# Patient Record
Sex: Female | Born: 1955 | Hispanic: Yes | Marital: Married | Smoking: Never smoker
Health system: Southern US, Community
[De-identification: ages and names within clinical notes are randomized; demographics above are authoritative.]

## PROBLEM LIST (undated history)

## (undated) DIAGNOSIS — Z9889 Other specified postprocedural states: Secondary | ICD-10-CM

## (undated) DIAGNOSIS — E119 Type 2 diabetes mellitus without complications: Secondary | ICD-10-CM

## (undated) DIAGNOSIS — G5603 Carpal tunnel syndrome, bilateral upper limbs: Secondary | ICD-10-CM

## (undated) DIAGNOSIS — R112 Nausea with vomiting, unspecified: Secondary | ICD-10-CM

## (undated) DIAGNOSIS — I10 Essential (primary) hypertension: Secondary | ICD-10-CM

## (undated) DIAGNOSIS — T4145XA Adverse effect of unspecified anesthetic, initial encounter: Secondary | ICD-10-CM

## (undated) DIAGNOSIS — K219 Gastro-esophageal reflux disease without esophagitis: Secondary | ICD-10-CM

## (undated) DIAGNOSIS — T8859XA Other complications of anesthesia, initial encounter: Secondary | ICD-10-CM

## (undated) DIAGNOSIS — E039 Hypothyroidism, unspecified: Secondary | ICD-10-CM

## (undated) HISTORY — PX: ABDOMINOPLASTY: SUR9

## (undated) HISTORY — DX: Carpal tunnel syndrome, bilateral upper limbs: G56.03

## (undated) HISTORY — PX: TUBAL LIGATION: SHX77

## (undated) HISTORY — PX: KNEE ARTHROSCOPY: SUR90

---
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
## 2018-05-01 ENCOUNTER — Other Ambulatory Visit: Payer: Self-pay | Admitting: Physician Assistant

## 2018-05-01 DIAGNOSIS — Z1231 Encounter for screening mammogram for malignant neoplasm of breast: Secondary | ICD-10-CM

## 2018-05-26 ENCOUNTER — Encounter: Payer: Self-pay | Admitting: Radiology

## 2018-05-26 ENCOUNTER — Ambulatory Visit
Admission: RE | Admit: 2018-05-26 | Discharge: 2018-05-26 | Disposition: A | Discharge status: Home / Self Care | Payer: BLUE CROSS/BLUE SHIELD | Source: Ambulatory Visit | Attending: Physician Assistant | Admitting: Physician Assistant

## 2018-05-26 DIAGNOSIS — Z1231 Encounter for screening mammogram for malignant neoplasm of breast: Secondary | ICD-10-CM

## 2018-06-03 ENCOUNTER — Other Ambulatory Visit: Payer: Self-pay | Admitting: Physician Assistant

## 2018-06-03 DIAGNOSIS — R928 Other abnormal and inconclusive findings on diagnostic imaging of breast: Secondary | ICD-10-CM

## 2018-06-16 ENCOUNTER — Ambulatory Visit
Admission: RE | Admit: 2018-06-16 | Discharge: 2018-06-16 | Disposition: A | Discharge status: Home / Self Care | Payer: BLUE CROSS/BLUE SHIELD | Source: Ambulatory Visit | Attending: Physician Assistant | Admitting: Physician Assistant

## 2018-06-16 ENCOUNTER — Ambulatory Visit: Payer: BLUE CROSS/BLUE SHIELD

## 2018-06-16 DIAGNOSIS — R928 Other abnormal and inconclusive findings on diagnostic imaging of breast: Secondary | ICD-10-CM

## 2018-06-23 ENCOUNTER — Encounter: Payer: Self-pay | Admitting: Neurology

## 2018-06-23 ENCOUNTER — Ambulatory Visit: Payer: BLUE CROSS/BLUE SHIELD | Admitting: Neurology

## 2018-06-23 ENCOUNTER — Ambulatory Visit (INDEPENDENT_AMBULATORY_CARE_PROVIDER_SITE_OTHER): Payer: BLUE CROSS/BLUE SHIELD | Admitting: Neurology

## 2018-06-23 DIAGNOSIS — G5603 Carpal tunnel syndrome, bilateral upper limbs: Secondary | ICD-10-CM | POA: Diagnosis not present

## 2018-06-23 HISTORY — DX: Carpal tunnel syndrome, bilateral upper limbs: G56.03

## 2018-06-23 NOTE — Procedures (Signed)
     HISTORY:  Ashley Bernard is a 62 year old Hispanic female with diabetes, obesity, and thyroid disease who reports a 1 year history of discomfort and numbness in both hands with some discomfort into the elbow and shoulder as well.  The patient reports no neck pain.  She is being evaluated for a possible neuropathy or a cervical radiculopathy.  NERVE CONDUCTION STUDIES:  Nerve conduction studies were performed on both upper extremities.  The distal motor latencies for the median nerves were prolonged bilaterally, normal motor amplitudes for these nerves were noted.  The distal motor latencies and motor amplitudes for the ulnar nerves were normal bilaterally.  The nerve conduction velocities for the median and ulnar nerves were normal bilaterally.  The sensory latencies for the median nerves were prolonged bilaterally, normal for the ulnar nerves bilaterally.  The F-wave latencies for the ulnar nerves were normal bilaterally.  EMG STUDIES:  EMG study was performed on the right upper extremity:  The first dorsal interosseous muscle reveals 2 to 4 K units with full recruitment. No fibrillations or positive waves were noted. The abductor pollicis brevis muscle reveals 2 to 4 K units with full recruitment. No fibrillations or positive waves were noted. The extensor indicis proprius muscle reveals 1 to 3 K units with full recruitment. No fibrillations or positive waves were noted. The pronator teres muscle reveals 2 to 3 K units with full recruitment. No fibrillations or positive waves were noted. The biceps muscle reveals 1 to 2 K units with full recruitment. No fibrillations or positive waves were noted. The triceps muscle reveals 2 to 4 K units with full recruitment. No fibrillations or positive waves were noted. The anterior deltoid muscle reveals 2 to 3 K units with full recruitment. No fibrillations or positive waves were noted. The cervical paraspinal muscles were tested at 2 levels. No  abnormalities of insertional activity were seen at either level tested. There was good relaxation.   IMPRESSION:  Nerve conduction studies done on both upper extremities shows evidence of mild bilateral carpal tunnel syndrome.  EMG evaluation of the right upper extremity was unremarkable without evidence of an overlying cervical radiculopathy.  Marlan Palau. Keith Javeria Briski MD 06/23/2018 11:03 AM  [facility] [address] [place], [place] [id number]  Phone [phone] Fax [phone]

## 2018-06-23 NOTE — Progress Notes (Addendum)
Please refer to EMG and nerve conduction study procedure note.    MNC    Nerve / Sites Muscle Latency Ref. Amplitude Ref. Rel Amp Segments Distance Velocity Ref. Area    ms ms mV mV %  cm m/s m/s mVms  R Median - APB     Wrist APB 5.7 ?4.4 6.3 ?4.0 100 Wrist - APB 7   27.6     Upper arm APB 9.5  6.0  95.9 Upper arm - Wrist 20 53 ?49 28.6  L Median - APB     Wrist APB 4.9 ?4.4 6.6 ?4.0 100 Wrist - APB 7   27.0     Upper arm APB 8.6  6.5  98.5 Upper arm - Wrist 20 54 ?49 27.6  R Ulnar - ADM     Wrist ADM 2.6 ?3.3 8.1 ?6.0 100 Wrist - ADM 7   22.1     B.Elbow ADM 6.1  7.2  89.3 B.Elbow - Wrist 20 57 ?49 20.1     A.Elbow ADM 8.1  7.4  103 A.Elbow - B.Elbow 10 51 ?49 22.0         A.Elbow - Wrist      L Ulnar - ADM     Wrist ADM 2.7 ?3.3 8.7 ?6.0 100 Wrist - ADM 7   22.0     B.Elbow ADM 6.1  7.1  82.3 B.Elbow - Wrist 20 57 ?49 20.9     A.Elbow ADM 8.0  7.2  101 A.Elbow - B.Elbow 10 53 ?49 22.0         A.Elbow - Wrist                 SNC    Nerve / Sites Rec. Site Peak Lat Ref.  Amp Ref. Segments Distance    ms ms V V  cm  R Median - Orthodromic (Dig II, Mid palm)     Dig II Wrist 5.5 ?3.4 3 ?10 Dig II - Wrist 13  L Median - Orthodromic (Dig II, Mid palm)     Dig II Wrist 5.3 ?3.4 6 ?10 Dig II - Wrist 13  R Ulnar - Orthodromic, (Dig V, Mid palm)     Dig V Wrist 2.8 ?3.1 7 ?5 Dig V - Wrist 11  L Ulnar - Orthodromic, (Dig V, Mid palm)     Dig V Wrist 2.7 ?3.1 5 ?5 Dig V - Wrist 1411              F  Wave    Nerve F Lat Ref.   ms ms  R Ulnar - ADM 25.6 ?32.0  L Ulnar - ADM 25.8 ?32.0

## 2018-06-23 NOTE — Progress Notes (Signed)
Please refer to EMG and nerve conduction study procedure note. 

## 2019-06-10 ENCOUNTER — Other Ambulatory Visit: Payer: Self-pay | Admitting: Physician Assistant

## 2019-06-10 DIAGNOSIS — Z1231 Encounter for screening mammogram for malignant neoplasm of breast: Secondary | ICD-10-CM

## 2019-06-22 ENCOUNTER — Other Ambulatory Visit: Payer: Self-pay

## 2019-06-22 ENCOUNTER — Ambulatory Visit
Admission: RE | Admit: 2019-06-22 | Discharge: 2019-06-22 | Disposition: A | Discharge status: Home / Self Care | Payer: PRIVATE HEALTH INSURANCE | Source: Ambulatory Visit | Attending: Physician Assistant | Admitting: Physician Assistant

## 2019-06-22 DIAGNOSIS — Z1231 Encounter for screening mammogram for malignant neoplasm of breast: Secondary | ICD-10-CM

## 2019-07-17 ENCOUNTER — Other Ambulatory Visit: Payer: Self-pay | Admitting: Orthopedic Surgery

## 2019-07-17 DIAGNOSIS — M87039 Idiopathic aseptic necrosis of unspecified carpus: Secondary | ICD-10-CM

## 2019-08-17 ENCOUNTER — Other Ambulatory Visit: Payer: PRIVATE HEALTH INSURANCE

## 2019-08-31 ENCOUNTER — Other Ambulatory Visit: Payer: Self-pay

## 2019-08-31 ENCOUNTER — Ambulatory Visit
Admission: RE | Admit: 2019-08-31 | Discharge: 2019-08-31 | Disposition: A | Discharge status: Home / Self Care | Payer: PRIVATE HEALTH INSURANCE | Source: Ambulatory Visit | Attending: Orthopedic Surgery | Admitting: Orthopedic Surgery

## 2019-08-31 DIAGNOSIS — M87039 Idiopathic aseptic necrosis of unspecified carpus: Secondary | ICD-10-CM

## 2019-11-17 ENCOUNTER — Other Ambulatory Visit: Payer: Self-pay | Admitting: Orthopedic Surgery

## 2019-11-20 ENCOUNTER — Other Ambulatory Visit: Payer: Self-pay | Admitting: Orthopedic Surgery

## 2019-12-02 ENCOUNTER — Encounter (HOSPITAL_BASED_OUTPATIENT_CLINIC_OR_DEPARTMENT_OTHER): Payer: Self-pay | Admitting: Orthopedic Surgery

## 2019-12-02 ENCOUNTER — Other Ambulatory Visit: Payer: Self-pay

## 2019-12-07 ENCOUNTER — Other Ambulatory Visit (HOSPITAL_COMMUNITY)
Admission: RE | Admit: 2019-12-07 | Discharge: 2019-12-07 | Disposition: A | Discharge status: Home / Self Care | Payer: BC Managed Care – PPO | Source: Ambulatory Visit | Attending: Orthopedic Surgery | Admitting: Orthopedic Surgery

## 2019-12-07 ENCOUNTER — Encounter (HOSPITAL_BASED_OUTPATIENT_CLINIC_OR_DEPARTMENT_OTHER)
Admission: RE | Admit: 2019-12-07 | Discharge: 2019-12-07 | Disposition: A | Discharge status: Home / Self Care | Payer: BC Managed Care – PPO | Source: Ambulatory Visit | Attending: Orthopedic Surgery | Admitting: Orthopedic Surgery

## 2019-12-07 DIAGNOSIS — I10 Essential (primary) hypertension: Secondary | ICD-10-CM | POA: Insufficient documentation

## 2019-12-07 DIAGNOSIS — R9431 Abnormal electrocardiogram [ECG] [EKG]: Secondary | ICD-10-CM | POA: Insufficient documentation

## 2019-12-07 DIAGNOSIS — Z01812 Encounter for preprocedural laboratory examination: Secondary | ICD-10-CM | POA: Diagnosis not present

## 2019-12-07 DIAGNOSIS — Z20822 Contact with and (suspected) exposure to covid-19: Secondary | ICD-10-CM | POA: Insufficient documentation

## 2019-12-07 DIAGNOSIS — Z01818 Encounter for other preprocedural examination: Secondary | ICD-10-CM | POA: Insufficient documentation

## 2019-12-07 DIAGNOSIS — E119 Type 2 diabetes mellitus without complications: Secondary | ICD-10-CM | POA: Insufficient documentation

## 2019-12-07 LAB — BASIC METABOLIC PANEL
Anion gap: 9 (ref 5–15)
BUN: 13 mg/dL (ref 8–23)
CO2: 26 mmol/L (ref 22–32)
Calcium: 9.2 mg/dL (ref 8.9–10.3)
Chloride: 108 mmol/L (ref 98–111)
Creatinine, Ser: 0.82 mg/dL (ref 0.44–1.00)
GFR calc Af Amer: >60 mL/min (ref >60)
GFR calc non Af Amer: >60 mL/min (ref >60)
Glucose, Bld: 99 mg/dL (ref 70–99)
Potassium: 4.5 mmol/L (ref 3.5–5.1)
Sodium: 143 mmol/L (ref 135–145)

## 2019-12-07 LAB — SARS CORONAVIRUS 2 (TAT 6-24 HRS): SARS Coronavirus 2: NEGATIVE

## 2019-12-07 NOTE — Progress Notes (Signed)

## 2019-12-07 NOTE — Progress Notes (Signed)
EKG reviewed by Dr. R. Fitzgerald, will proceed with surgery as scheduled. 

## 2019-12-09 NOTE — Anesthesia Preprocedure Evaluation (Addendum)
Anesthesia Evaluation  Patient identified by MRN, date of birth, ID band Patient awake    Reviewed: Allergy & Precautions, NPO status , Patient's Chart, lab work & pertinent test results  History of Anesthesia Complications (+) PONV  Airway Mallampati: II  TM Distance: >3 FB Neck ROM: Full    Dental no notable dental hx. (+) Teeth Intact, Dental Advisory Given   Pulmonary neg pulmonary ROS,    Pulmonary exam normal breath sounds clear to auscultation       Cardiovascular hypertension, Pt. on medications negative cardio ROS Normal cardiovascular exam Rhythm:Regular Rate:Normal     Neuro/Psych negative neurological ROS  negative psych ROS   GI/Hepatic Neg liver ROS, GERD  ,  Endo/Other  diabetes, Type 2, Oral Hypoglycemic AgentsHypothyroidism   Renal/GU K+ 4.5 Cr 0.82     Musculoskeletal negative musculoskeletal ROS (+)   Abdominal   Peds  Hematology negative hematology ROS (+)   Anesthesia Other Findings   Reproductive/Obstetrics                            Anesthesia Physical Anesthesia Plan  ASA: III  Anesthesia Plan: Regional   Post-op Pain Management:    Induction: Intravenous  PONV Risk Score and Plan: 2 and Treatment may vary due to age or medical condition, Ondansetron and Dexamethasone  Airway Management Planned: Nasal Cannula and Natural Airway  Additional Equipment: None  Intra-op Plan:   Post-operative Plan:   Informed Consent:     Dental advisory given  Plan Discussed with: CRNA  Anesthesia Plan Comments: (R supraclavicular block w MAC Pt Spanish speaking hx taken w translator present)      Anesthesia Quick Evaluation

## 2019-12-10 ENCOUNTER — Encounter (HOSPITAL_BASED_OUTPATIENT_CLINIC_OR_DEPARTMENT_OTHER): Payer: Self-pay | Admitting: Orthopedic Surgery

## 2019-12-10 ENCOUNTER — Ambulatory Visit (HOSPITAL_BASED_OUTPATIENT_CLINIC_OR_DEPARTMENT_OTHER)
Admission: RE | Admit: 2019-12-10 | Discharge: 2019-12-10 | Disposition: A | Discharge status: Home / Self Care | Payer: BC Managed Care – PPO | Attending: Orthopedic Surgery | Admitting: Orthopedic Surgery

## 2019-12-10 ENCOUNTER — Ambulatory Visit (HOSPITAL_BASED_OUTPATIENT_CLINIC_OR_DEPARTMENT_OTHER): Payer: BC Managed Care – PPO | Admitting: Anesthesiology

## 2019-12-10 ENCOUNTER — Encounter (HOSPITAL_BASED_OUTPATIENT_CLINIC_OR_DEPARTMENT_OTHER)
Admission: RE | Disposition: A | Discharge status: Home / Self Care | Payer: Self-pay | Source: Home / Self Care | Attending: Orthopedic Surgery

## 2019-12-10 ENCOUNTER — Other Ambulatory Visit: Payer: Self-pay

## 2019-12-10 DIAGNOSIS — Z79899 Other long term (current) drug therapy: Secondary | ICD-10-CM | POA: Diagnosis not present

## 2019-12-10 DIAGNOSIS — M931 Kienbock's disease of adults: Secondary | ICD-10-CM | POA: Insufficient documentation

## 2019-12-10 DIAGNOSIS — Z79890 Hormone replacement therapy (postmenopausal): Secondary | ICD-10-CM | POA: Insufficient documentation

## 2019-12-10 DIAGNOSIS — Z7984 Long term (current) use of oral hypoglycemic drugs: Secondary | ICD-10-CM | POA: Insufficient documentation

## 2019-12-10 DIAGNOSIS — I10 Essential (primary) hypertension: Secondary | ICD-10-CM | POA: Diagnosis not present

## 2019-12-10 DIAGNOSIS — E039 Hypothyroidism, unspecified: Secondary | ICD-10-CM | POA: Diagnosis not present

## 2019-12-10 DIAGNOSIS — Z791 Long term (current) use of non-steroidal anti-inflammatories (NSAID): Secondary | ICD-10-CM | POA: Insufficient documentation

## 2019-12-10 DIAGNOSIS — E119 Type 2 diabetes mellitus without complications: Secondary | ICD-10-CM | POA: Diagnosis not present

## 2019-12-10 HISTORY — DX: Other complications of anesthesia, initial encounter: T88.59XA

## 2019-12-10 HISTORY — DX: Essential (primary) hypertension: I10

## 2019-12-10 HISTORY — DX: Hypothyroidism, unspecified: E03.9

## 2019-12-10 HISTORY — DX: Gastro-esophageal reflux disease without esophagitis: K21.9

## 2019-12-10 HISTORY — DX: Type 2 diabetes mellitus without complications: E11.9

## 2019-12-10 HISTORY — PX: CARPECTOMY: SHX5004

## 2019-12-10 HISTORY — DX: Other specified postprocedural states: Z98.890

## 2019-12-10 HISTORY — DX: Other specified postprocedural states: R11.2

## 2019-12-10 LAB — GLUCOSE, CAPILLARY
Glucose-Capillary: 113 mg/dL — ABNORMAL HIGH (ref 70–99)
Glucose-Capillary: 104 mg/dL — ABNORMAL HIGH (ref 70–99)

## 2019-12-10 SURGERY — CARPECTOMY
Anesthesia: Regional | Site: Wrist | Laterality: Right

## 2019-12-10 MED ORDER — FENTANYL CITRATE (PF) 100 MCG/2ML IJ SOLN
INTRAMUSCULAR | Status: AC
  Filled 2019-12-10: qty 2

## 2019-12-10 MED ORDER — ROPIVACAINE HCL 5 MG/ML IJ SOLN
INTRAMUSCULAR | Status: DC | PRN
  Administered 2019-12-10: 150 mg via PERINEURAL

## 2019-12-10 MED ORDER — LACTATED RINGERS IV SOLN: INTRAVENOUS | Status: DC

## 2019-12-10 MED ORDER — CEFAZOLIN SODIUM-DEXTROSE 2-4 GM/100ML-% IV SOLN
2.0000 g | INTRAVENOUS | Status: AC
  Administered 2019-12-10: 09:00:00 2 g via INTRAVENOUS

## 2019-12-10 MED ORDER — ACETAMINOPHEN 10 MG/ML IV SOLN: 1000.0000 mg | Freq: Once | INTRAVENOUS | Status: DC | PRN

## 2019-12-10 MED ORDER — PROPOFOL 500 MG/50ML IV EMUL
INTRAVENOUS | Status: DC | PRN
  Administered 2019-12-10: 50 ug/kg/min via INTRAVENOUS

## 2019-12-10 MED ORDER — CHLORHEXIDINE GLUCONATE 4 % EX LIQD: 60.0000 mL | Freq: Once | CUTANEOUS | Status: DC

## 2019-12-10 MED ORDER — FENTANYL CITRATE (PF) 100 MCG/2ML IJ SOLN
50.0000 ug | INTRAMUSCULAR | Status: DC | PRN
  Administered 2019-12-10 (×2): 50 ug via INTRAVENOUS

## 2019-12-10 MED ORDER — EPHEDRINE 5 MG/ML INJ
INTRAVENOUS | Status: AC
  Filled 2019-12-10: qty 10

## 2019-12-10 MED ORDER — ONDANSETRON HCL 4 MG/2ML IJ SOLN: 4.0000 mg | Freq: Once | INTRAMUSCULAR | Status: DC | PRN

## 2019-12-10 MED ORDER — LIDOCAINE HCL (CARDIAC) PF 100 MG/5ML IV SOSY
PREFILLED_SYRINGE | INTRAVENOUS | Status: DC | PRN
  Administered 2019-12-10: 40 mg via INTRAVENOUS

## 2019-12-10 MED ORDER — EPHEDRINE SULFATE-NACL 50-0.9 MG/10ML-% IV SOSY
PREFILLED_SYRINGE | INTRAVENOUS | Status: DC | PRN
  Administered 2019-12-10 (×3): 5 mg via INTRAVENOUS
  Administered 2019-12-10: 10 mg via INTRAVENOUS

## 2019-12-10 MED ORDER — CLONIDINE HCL (ANALGESIA) 100 MCG/ML EP SOLN
EPIDURAL | Status: DC | PRN
  Administered 2019-12-10: 100 ug

## 2019-12-10 MED ORDER — PROPOFOL 500 MG/50ML IV EMUL
INTRAVENOUS | Status: AC
  Filled 2019-12-10: qty 50

## 2019-12-10 MED ORDER — MIDAZOLAM HCL 2 MG/2ML IJ SOLN
1.0000 mg | INTRAMUSCULAR | Status: DC | PRN
  Administered 2019-12-10 (×2): 1 mg via INTRAVENOUS

## 2019-12-10 MED ORDER — MIDAZOLAM HCL 2 MG/2ML IJ SOLN
INTRAMUSCULAR | Status: AC
  Filled 2019-12-10: qty 2

## 2019-12-10 MED ORDER — CEFAZOLIN SODIUM-DEXTROSE 2-4 GM/100ML-% IV SOLN
INTRAVENOUS | Status: AC
  Filled 2019-12-10: qty 100

## 2019-12-10 MED ORDER — ONDANSETRON HCL 4 MG/2ML IJ SOLN
INTRAMUSCULAR | Status: DC | PRN
  Administered 2019-12-10: 4 mg via INTRAVENOUS

## 2019-12-10 MED ORDER — PHENYLEPHRINE 40 MCG/ML (10ML) SYRINGE FOR IV PUSH (FOR BLOOD PRESSURE SUPPORT)
PREFILLED_SYRINGE | INTRAVENOUS | Status: DC | PRN
  Administered 2019-12-10 (×4): 40 ug via INTRAVENOUS

## 2019-12-10 MED ORDER — OXYCODONE-ACETAMINOPHEN 5-325 MG PO TABS: ORAL_TABLET | ORAL | 0 refills | Status: AC

## 2019-12-10 MED ORDER — FENTANYL CITRATE (PF) 100 MCG/2ML IJ SOLN: 25.0000 ug | INTRAMUSCULAR | Status: DC | PRN

## 2019-12-10 MED ORDER — HYDROCODONE-ACETAMINOPHEN 7.5-325 MG PO TABS: 1.0000 | ORAL_TABLET | Freq: Once | ORAL | Status: DC | PRN

## 2019-12-10 SURGICAL SUPPLY — 56 items
BLADE ARTHRO LOK 4 BEAVER (BLADE) ×2 IMPLANT
BLADE ARTHRO LOK 4MM BEAVER (BLADE) ×1
BLADE EAR TYMPAN 2.5 60D BEAV (BLADE) ×3 IMPLANT
BLADE MINI RND TIP GREEN BEAV (BLADE) ×3 IMPLANT
BLADE SURG 15 STRL LF DISP TIS (BLADE) ×2 IMPLANT
BLADE SURG 15 STRL SS (BLADE) ×4
BNDG ELASTIC 4X5.8 VLCR STR LF (GAUZE/BANDAGES/DRESSINGS) IMPLANT
BNDG ESMARK 4X9 LF (GAUZE/BANDAGES/DRESSINGS) ×3 IMPLANT
BNDG GAUZE ELAST 4 BULKY (GAUZE/BANDAGES/DRESSINGS) ×3 IMPLANT
CHLORAPREP W/TINT 26 (MISCELLANEOUS) ×3 IMPLANT
CORD BIPOLAR FORCEPS 12FT (ELECTRODE) ×3 IMPLANT
COVER BACK TABLE 60X90IN (DRAPES) ×3 IMPLANT
COVER MAYO STAND STRL (DRAPES) ×3 IMPLANT
COVER WAND RF STERILE (DRAPES) IMPLANT
CUFF TOURN SGL QUICK 18X4 (TOURNIQUET CUFF) IMPLANT
DECANTER SPIKE VIAL GLASS SM (MISCELLANEOUS) IMPLANT
DRAPE EXTREMITY T 121X128X90 (DISPOSABLE) ×3 IMPLANT
DRAPE OEC MINIVIEW 54X84 (DRAPES) ×3 IMPLANT
DRAPE SURG 17X23 STRL (DRAPES) ×3 IMPLANT
GAUZE SPONGE 4X4 12PLY STRL (GAUZE/BANDAGES/DRESSINGS) ×3 IMPLANT
GAUZE XEROFORM 1X8 LF (GAUZE/BANDAGES/DRESSINGS) ×3 IMPLANT
GLOVE BIO SURGEON STRL SZ 6 (GLOVE) ×3 IMPLANT
GLOVE BIO SURGEON STRL SZ7.5 (GLOVE) ×3 IMPLANT
GLOVE BIOGEL PI IND STRL 6.5 (GLOVE) ×2 IMPLANT
GLOVE BIOGEL PI IND STRL 8 (GLOVE) ×1 IMPLANT
GLOVE BIOGEL PI IND STRL 8.5 (GLOVE) ×1 IMPLANT
GLOVE BIOGEL PI INDICATOR 6.5 (GLOVE) ×4
GLOVE BIOGEL PI INDICATOR 8 (GLOVE) ×2
GLOVE BIOGEL PI INDICATOR 8.5 (GLOVE) ×2
GLOVE ECLIPSE 6.5 STRL STRAW (GLOVE) ×3 IMPLANT
GLOVE SURG ORTHO 8.0 STRL STRW (GLOVE) ×3 IMPLANT
GOWN STRL REUS W/ TWL LRG LVL3 (GOWN DISPOSABLE) ×2 IMPLANT
GOWN STRL REUS W/TWL LRG LVL3 (GOWN DISPOSABLE) ×4
GOWN STRL REUS W/TWL XL LVL3 (GOWN DISPOSABLE) ×6 IMPLANT
NS IRRIG 1000ML POUR BTL (IV SOLUTION) ×3 IMPLANT
PACK BASIN DAY SURGERY FS (CUSTOM PROCEDURE TRAY) ×3 IMPLANT
PAD CAST 4YDX4 CTTN HI CHSV (CAST SUPPLIES) ×1 IMPLANT
PADDING CAST ABS 3INX4YD NS (CAST SUPPLIES)
PADDING CAST ABS 4INX4YD NS (CAST SUPPLIES) ×2
PADDING CAST ABS COTTON 3X4 (CAST SUPPLIES) IMPLANT
PADDING CAST ABS COTTON 4X4 ST (CAST SUPPLIES) ×1 IMPLANT
PADDING CAST COTTON 4X4 STRL (CAST SUPPLIES) ×2
SLEEVE SCD COMPRESS KNEE MED (MISCELLANEOUS) ×3 IMPLANT
SLING ARM FOAM STRAP LRG (SOFTGOODS) ×3 IMPLANT
SPLINT PLASTER CAST XFAST 3X15 (CAST SUPPLIES) ×2 IMPLANT
SPLINT PLASTER XTRA FASTSET 3X (CAST SUPPLIES) ×4
STOCKINETTE 4X48 STRL (DRAPES) ×3 IMPLANT
SUT ETHILON 4 0 PS 2 18 (SUTURE) ×3 IMPLANT
SUT MERSILENE 4 0 P 3 (SUTURE) ×3 IMPLANT
SUT VIC AB 2-0 SH 27 (SUTURE)
SUT VIC AB 2-0 SH 27XBRD (SUTURE) IMPLANT
SUT VICRYL 4-0 PS2 18IN ABS (SUTURE) ×3 IMPLANT
SYR BULB 3OZ (MISCELLANEOUS) ×3 IMPLANT
SYR CONTROL 10ML LL (SYRINGE) IMPLANT
TOWEL GREEN STERILE FF (TOWEL DISPOSABLE) ×3 IMPLANT
UNDERPAD 30X36 HEAVY ABSORB (UNDERPADS AND DIAPERS) ×3 IMPLANT

## 2019-12-10 NOTE — Discharge Instructions (Addendum)

## 2019-12-10 NOTE — Op Note (Signed)
I assisted Surgeon(s) and Role:    * [name], MD - Primary    Cindee Salt, MD - Assisting on the Procedure(s): RIGHT PROXIMAL ROW CARPECTOMY on 12/10/2019.  I provided assistance on this case as follows: setup, approach, posterior interosseous nerve resection, removal of the scaphoid, lunate and triquetrum, radial styloidectomy, repair of the capsule closure of the incisions and  Application of the splints. Electronically signed by: Cindee Salt, MD Date: 12/10/2019 Time: 10:54 AM

## 2019-12-10 NOTE — Transfer of Care (Signed)
Immediate Anesthesia Transfer of Care Note  Patient: Ashley Bernard  Procedure(s) Performed: RIGHT PROXIMAL ROW CARPECTOMY (Right Wrist)  Patient Location: PACU  Anesthesia Type:MAC and Regional  Level of Consciousness: awake  Airway & Oxygen Therapy: Patient Spontanous Breathing and Patient connected to nasal cannula oxygen  Post-op Assessment: Report given to RN and Post -op Vital signs reviewed and stable  Post vital signs: Reviewed and stable  Last Vitals:  Vitals Value Taken Time  BP 106/73 12/10/19 1055  Temp    Pulse 66 12/10/19 1058  Resp 20 12/10/19 1058  SpO2 98 % 12/10/19 1058  Vitals shown include unvalidated device data.  Last Pain:  Vitals:   12/10/19 0738  TempSrc: Temporal  PainSc:          Complications: No apparent anesthesia complications

## 2019-12-10 NOTE — Anesthesia Postprocedure Evaluation (Signed)
Anesthesia Post Note  Patient: Ashley Bernard  Procedure(s) Performed: RIGHT PROXIMAL ROW CARPECTOMY POSTERIOR INTER OSSEOUS NERVE NEUROTOMY RADICAL STYLOIDECTOMY (Right Wrist)     Patient location during evaluation: PACU Anesthesia Type: Regional Level of consciousness: awake and alert Pain management: pain level controlled Vital Signs Assessment: post-procedure vital signs reviewed and stable Respiratory status: spontaneous breathing, nonlabored ventilation, respiratory function stable and patient connected to nasal cannula oxygen Cardiovascular status: stable and blood pressure returned to baseline Postop Assessment: no apparent nausea or vomiting Anesthetic complications: no    Last Vitals:  Vitals:   12/10/19 1200 12/10/19 1213  BP: 118/78 106/72  Pulse: 67 63  Resp: (!) 21   Temp: 36.6 C   SpO2: 96% 95%    Last Pain:  Vitals:   12/10/19 1200  TempSrc:   PainSc: 0-No pain                 Trevor Iha

## 2019-12-10 NOTE — Op Note (Signed)
NAME: [facility] Dorothea Glassman [place] MEDICAL RECORD NO: [id number] DATE OF BIRTH: [birth year] FACILITY: [facility] LOCATION: [facility] PHYSICIAN: Tennis Must, MD   OPERATIVE REPORT   DATE OF PROCEDURE: 12/10/19    PREOPERATIVE DIAGNOSIS:   Right wrist Kienbock's with lunate collapse   POSTOPERATIVE DIAGNOSIS:   Right wrist Kienbock's  with lunate collapse   PROCEDURE:   1.  Right wrist proximal row carpectomy 2.  Right radial styloidectomy 3.  Right posterior interosseous nerve neurectomy for management of pre-existing pain   SURGEON:  Leanora Cover, M.D.   ASSISTANT: Daryll Brod, MD   ANESTHESIA:  Regional with sedation   INTRAVENOUS FLUIDS:  Per anesthesia flow sheet.   ESTIMATED BLOOD LOSS:  Minimal.   COMPLICATIONS:  None.   SPECIMENS:   Right wrist lunate to pathology   TOURNIQUET TIME:    Total Tourniquet Time Documented: Upper Arm (Right) - 91 minutes Total: Upper Arm (Right) - 91 minutes    DISPOSITION:  Stable to PACU.   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   There is collapse of the lunate.  She has pain in the wrist.  She wishes to undergo proximal row carpectomy with posterior interosseous nerve neurectomy and possible radial styloidectomy as necessary.  Risks, benefits and alternatives of surgery were discussed including the risks of blood loss, infection, damage to nerves, vessels, tendons, ligaments, bone for surgery, need for additional surgery, complications with wound healing, continued pain, stiffness, loss of range of motion and strength.  She voiced understanding of these risks and elected to proceed.  OPERATIVE COURSE:  After being identified preoperatively by myself,  the patient and I agreed on the procedure and site of the procedure.  The surgical site was marked.  Surgical consent had been signed. She was given IV antibiotics as preoperative antibiotic prophylaxis. She was transferred to the  operating room and placed on the operating table in supine position with the Right upper extremity on an arm board.  Sedation was induced by the anesthesiologist. A regional block had been performed by anesthesia in preoperative holding.   Right upper extremity was prepped and draped in normal sterile orthopedic fashion.  A surgical pause was performed between the surgeons, anesthesia, and operating room staff and all were in agreement as to the patient, procedure, and site of procedure.  Tourniquet at the proximal aspect of the extremity was inflated to 250 mmHg after exsanguination of the arm with an Esmarch bandage.    Incision was made on the dorsum of the wrist in a longitudinal fashion.  This carried in subcutaneous tissues by spreading technique.  Bipolar electrocautery is used to obtain hemostasis.  The fourth dorsal compartment was opened and the tendons retracted.  The posterior interosseous nerve was identified.  It was resected including approximately 1 and half centimeter length using the bipolar.  This was done to aid in management of her preoperative pain.  The capsule was incised in a longitudinal fashion and teed at its base leaving a cuff for repair.  The lunate was identified.  There was collapse.  The dorsal portion had fragmented off.  This was removed.  The lunate and its entirety was sent to pathology for examination.  The proximal for the capitate had good articular cartilage coverage still.  The lunate facet had good articular cartilage as well.  Was felt the capsular interposition was not necessary.  The triquetrum was then carefully freed up with soft tissue attachments using  a freer elevator and Beaver blade.  It was removed with rongeurs.  The remaining portion of the volar aspect of the lunate was then removed again with elevators, Beaver blade, rongeurs.  The scaphoid was then excised.  This required curettes rongeurs elevators and the Beaver blade.  It was removed in a piecemeal  fashion.  The radioscaphocapitate ligament was protected throughout the case.  The capitate fell back into the lunate facet nicely.  There was contact at the radial styloid.  A radial styloidectomy was performed using the rongeurs while protecting the origin of the radioscaphocapitate ligament.  The wound was copiously irrigated with sterile saline.  The capsule was repaired with 4-0 Mersilene suture in a figure-of-eight fashion.  The extensor retinaculum was then repaired back over the fourth dorsal compartment tendons with a 4-0 Vicryl suture in a figure-of-eight fashion.  Inverted interrupted Vicryl sutures were placed in the subcutaneous tissues and skin was closed with 4-0 nylon in a horizontal mattress fashion.  The wound was dressed with sterile Xeroform 4 x 4's and wrapped with a Kerlix bandage.  Volar and dorsal slab splint was placed with the wrist in neutral position.  This was wrapped with Kerlix and Ace bandage.  The tourniquet was deflated at 91 minutes.  Fingertips were pink with brisk capillary refill after deflation of tourniquet.  Repeat radiographs were taken through the splint and showed good maintained position of the capitate and the lunate facet in both AP and lateral views. The operative  drapes were broken down.  The patient was awoken from anesthesia safely.  She was transferred back to the stretcher and taken to PACU in stable condition.  I will see her back in the office in 1 week for postoperative followup.  I will give her a prescription for Percocet 5/325 1-2 tabs PO q6 hours prn pain, dispense # 30.   [name], MD Electronically signed, 12/10/19

## 2019-12-10 NOTE — Anesthesia Procedure Notes (Signed)
Anesthesia Regional Block: Supraclavicular block   Pre-Anesthetic Checklist: ,, timeout performed, Correct Patient, Correct Site, Correct Laterality, Correct Procedure, Correct Position, site marked, Risks and benefits discussed,  Surgical consent,  Pre-op evaluation,  At surgeon's request and post-op pain management  Laterality: Right and Upper  Prep: chloraprep       Needles:  Injection technique: Single-shot  Needle Type: Echogenic Needle     Needle Length: 5cm  Needle Gauge: 21     Additional Needles:   Procedures:,,,, ultrasound used (permanent image in chart),,,,  Narrative:  Start time: 12/10/2019 8:11 AM End time: 12/10/2019 8:17 AM Injection made incrementally with aspirations every 5 mL.  Performed by: Personally  Anesthesiologist: Trevor Iha, MD  Additional Notes: Block tested prior to procedure

## 2019-12-10 NOTE — H&P (Signed)
Ashley Bernard Spotted Ashley Bernard is an 64 y.o. female.   Chief Complaint: right wrist pain HPI: 64 yo female with Kienbocks disease right wrist with collapse of lunate.  This is painful and bothersome to her.  She wishes to proceed with proximal row carpectomy, posterior interosseous nerve neurectomy, possible radial styloidectomy for management of the pain.  Allergies: No Known Allergies  Past Medical History:  Diagnosis Date  . Bilateral carpal tunnel syndrome 06/23/2018  . Complication of anesthesia   . Diabetes mellitus without complication (HCC)   . GERD (gastroesophageal reflux disease)   . Hypertension   . Hypothyroidism   . PONV (postoperative nausea and vomiting)     Past Surgical History:  Procedure Laterality Date  . ABDOMINOPLASTY    . KNEE ARTHROSCOPY Right   . TUBAL LIGATION      Family History: Family History  Problem Relation Age of Onset  . Breast cancer Neg Hx     Social History:   reports that she has never smoked. She has never used smokeless tobacco. She reports that she does not drink alcohol or use drugs.  Medications: Medications Prior to Admission  Medication Sig Dispense Refill  . celecoxib (CELEBREX) 50 MG capsule Take 50 mg by mouth daily as needed for pain.    Marland Kitchen glyBURIDE-metformin (GLUCOVANCE) 1.25-250 MG tablet Take 1 tablet by mouth daily with breakfast.    . levothyroxine (SYNTHROID) 50 MCG tablet Take 50 mcg by mouth daily before breakfast.    . losartan (COZAAR) 100 MG tablet Take 100 mg by mouth daily.    Marland Kitchen atorvastatin (LIPITOR) 10 MG tablet Take 10 mg by mouth daily.      Results for orders placed or performed during the hospital encounter of 12/10/19 (from the past 48 hour(s))  Glucose, capillary     Status: Abnormal   Collection Time: 12/10/19  7:33 AM  Result Value Ref Range   Glucose-Capillary 113 (H) 70 - 99 mg/dL    No results found.   A comprehensive review of systems was negative.  Blood pressure 112/68, pulse (!) 59,  temperature (!) 96.5 F (35.8 C), temperature source Temporal, resp. rate 13, height 5' (1.524 m), weight 82.7 kg, SpO2 99 %.  General appearance: alert, cooperative and appears stated age Head: Normocephalic, without obvious abnormality, atraumatic Neck: supple, symmetrical, trachea midline Cardio: regular rate and rhythm Resp: clear to auscultation bilaterally Extremities: Intact sensation and capillary refill all digits.  +epl/fpl/io.  No wounds.  Pulses: 2+ and symmetric Skin: Skin color, texture, turgor normal. No rashes or lesions Neurologic: Grossly normal Incision/Wound: none  Assessment/Plan Right wrist kienbocks disease with collapse of lunate.  Plan right wrist proximal row carpectomy with possible capsular interposition, posterior interosseous nerve neurectomy, possible radial styloidectomy.  Non operative and operative treatment options have been discussed with the patient and patient wishes to proceed with operative treatment. Risks, benefits, and alternatives of surgery have been discussed and the patient agrees with the plan of care. A hospital interpreter was used.  We discussed the procedure including removal of the proximal row of the carpus, expectations of lost range of motion and strength, and that she may have some residual pain but hopefully diminished.  We also discussed postoperative splinting and restrictions on lifting for 2-3 months.  She voiced understanding and agrees with the plan of care.  Ashley Bernard 12/10/2019, 8:49 AM

## 2019-12-10 NOTE — Progress Notes (Signed)
Assisted Dr. Houser with right, ultrasound guided, supraclavicular block. Side rails up, monitors on throughout procedure. See vital signs in flow sheet. Tolerated Procedure well. 

## 2019-12-11 ENCOUNTER — Encounter: Payer: Self-pay | Admitting: *Deleted

## 2019-12-11 LAB — SURGICAL PATHOLOGY

## 2020-05-30 ENCOUNTER — Other Ambulatory Visit: Payer: Self-pay | Admitting: Physician Assistant

## 2020-05-30 DIAGNOSIS — Z1231 Encounter for screening mammogram for malignant neoplasm of breast: Secondary | ICD-10-CM

## 2020-07-04 ENCOUNTER — Ambulatory Visit
Admission: RE | Admit: 2020-07-04 | Discharge: 2020-07-04 | Disposition: A | Discharge status: Home / Self Care | Payer: BC Managed Care – PPO | Source: Ambulatory Visit | Attending: Physician Assistant | Admitting: Physician Assistant

## 2020-07-04 ENCOUNTER — Other Ambulatory Visit: Payer: Self-pay

## 2020-07-04 DIAGNOSIS — Z1231 Encounter for screening mammogram for malignant neoplasm of breast: Secondary | ICD-10-CM

## 2021-05-30 ENCOUNTER — Other Ambulatory Visit: Payer: Self-pay | Admitting: Physician Assistant

## 2021-05-30 DIAGNOSIS — Z1231 Encounter for screening mammogram for malignant neoplasm of breast: Secondary | ICD-10-CM

## 2021-07-24 ENCOUNTER — Ambulatory Visit
Admission: RE | Admit: 2021-07-24 | Discharge: 2021-07-24 | Disposition: A | Discharge status: Home / Self Care | Payer: PRIVATE HEALTH INSURANCE | Source: Ambulatory Visit | Attending: Physician Assistant | Admitting: Physician Assistant

## 2021-07-24 ENCOUNTER — Other Ambulatory Visit: Payer: Self-pay

## 2021-07-24 DIAGNOSIS — Z1231 Encounter for screening mammogram for malignant neoplasm of breast: Secondary | ICD-10-CM

## 2022-04-05 ENCOUNTER — Ambulatory Visit: Payer: PRIVATE HEALTH INSURANCE | Admitting: Emergency Medicine

## 2022-07-11 ENCOUNTER — Ambulatory Visit (INDEPENDENT_AMBULATORY_CARE_PROVIDER_SITE_OTHER): Payer: 59 | Admitting: Emergency Medicine

## 2022-07-11 ENCOUNTER — Ambulatory Visit: Payer: PRIVATE HEALTH INSURANCE | Admitting: Emergency Medicine

## 2022-07-11 ENCOUNTER — Encounter: Payer: Self-pay | Admitting: Emergency Medicine

## 2022-07-11 VITALS — BP 136/88 | HR 77 | Temp 98.5°F | Ht 61.0 in | Wt 188.1 lb

## 2022-07-11 DIAGNOSIS — E039 Hypothyroidism, unspecified: Secondary | ICD-10-CM

## 2022-07-11 DIAGNOSIS — E1169 Type 2 diabetes mellitus with other specified complication: Secondary | ICD-10-CM

## 2022-07-11 DIAGNOSIS — E785 Hyperlipidemia, unspecified: Secondary | ICD-10-CM

## 2022-07-11 DIAGNOSIS — Z7689 Persons encountering health services in other specified circumstances: Secondary | ICD-10-CM

## 2022-07-11 DIAGNOSIS — I10 Essential (primary) hypertension: Secondary | ICD-10-CM | POA: Diagnosis not present

## 2022-07-11 DIAGNOSIS — I152 Hypertension secondary to endocrine disorders: Secondary | ICD-10-CM | POA: Insufficient documentation

## 2022-07-11 DIAGNOSIS — R1013 Epigastric pain: Secondary | ICD-10-CM | POA: Diagnosis not present

## 2022-07-11 DIAGNOSIS — Z8619 Personal history of other infectious and parasitic diseases: Secondary | ICD-10-CM | POA: Insufficient documentation

## 2022-07-11 DIAGNOSIS — E1159 Type 2 diabetes mellitus with other circulatory complications: Secondary | ICD-10-CM

## 2022-07-11 LAB — COMPREHENSIVE METABOLIC PANEL
ALT: 57 U/L — ABNORMAL HIGH (ref 0–35)
AST: 47 U/L — ABNORMAL HIGH (ref 0–37)
Albumin: 4.3 g/dL (ref 3.5–5.2)
Alkaline Phosphatase: 62 U/L (ref 39–117)
BUN: 18 mg/dL (ref 6–23)
CO2: 29 mEq/L (ref 19–32)
Calcium: 9.7 mg/dL (ref 8.4–10.5)
Chloride: 99 mEq/L (ref 96–112)
Creatinine, Ser: 0.85 mg/dL (ref 0.40–1.20)
GFR: 71.56 mL/min (ref >60.00)
Glucose, Bld: 80 mg/dL (ref 70–99)
Potassium: 3.4 mEq/L — ABNORMAL LOW (ref 3.5–5.1)
Sodium: 137 mEq/L (ref 135–145)
Total Bilirubin: 0.4 mg/dL (ref 0.2–1.2)
Total Protein: 7.6 g/dL (ref 6.0–8.3)

## 2022-07-11 LAB — CBC WITH DIFFERENTIAL/PLATELET
Basophils Absolute: 0.1 10*3/uL (ref 0.0–0.1)
Basophils Relative: 0.8 % (ref 0.0–3.0)
Eosinophils Absolute: 0.2 10*3/uL (ref 0.0–0.7)
Eosinophils Relative: 2.6 % (ref 0.0–5.0)
HCT: 38.1 % (ref 36.0–46.0)
Hemoglobin: 12.9 g/dL (ref 12.0–15.0)
Lymphocytes Relative: 29.7 % (ref 12.0–46.0)
Lymphs Abs: 1.9 10*3/uL (ref 0.7–4.0)
MCHC: 33.9 g/dL (ref 30.0–36.0)
MCV: 87.3 fl (ref 78.0–100.0)
Monocytes Absolute: 0.6 10*3/uL (ref 0.1–1.0)
Monocytes Relative: 9.2 % (ref 3.0–12.0)
Neutro Abs: 3.7 10*3/uL (ref 1.4–7.7)
Neutrophils Relative %: 57.7 % (ref 43.0–77.0)
Platelets: 290 10*3/uL (ref 150.0–400.0)
RBC: 4.37 Mil/uL (ref 3.87–5.11)
RDW: 12.9 % (ref 11.5–15.5)
WBC: 6.4 10*3/uL (ref 4.0–10.5)

## 2022-07-11 LAB — TSH: TSH: 1.2 u[IU]/mL (ref 0.35–5.50)

## 2022-07-11 LAB — MICROALBUMIN / CREATININE URINE RATIO
Creatinine,U: 45.1 mg/dL
Microalb Creat Ratio: 1.6 mg/g (ref 0.0–30.0)
Microalb, Ur: <0.7 mg/dL (ref 0.0–1.9)

## 2022-07-11 LAB — LIPID PANEL
Cholesterol: 168 mg/dL (ref 0–200)
HDL: 45.9 mg/dL (ref >39.00)
LDL Cholesterol: 89 mg/dL (ref 0–99)
NonHDL: 122.14
Total CHOL/HDL Ratio: 4
Triglycerides: 164 mg/dL — ABNORMAL HIGH (ref 0.0–149.0)
VLDL: 32.8 mg/dL (ref 0.0–40.0)

## 2022-07-11 LAB — HEMOGLOBIN A1C: Hgb A1c MFr Bld: 7.1 % — ABNORMAL HIGH (ref 4.6–6.5)

## 2022-07-11 NOTE — Assessment & Plan Note (Signed)
Almost daily episodes of heartburn. Occasional nausea.  No signs of upper GI bleed. History of recurrent H. pylori infection. Needs GI evaluation and possible upper endoscopy. GI referral placed today.

## 2022-07-11 NOTE — Patient Instructions (Signed)
Mantenimiento de la salud despus de los 65 aos de edad Health Maintenance After Age 65 Despus de los 65 aos de edad, corre un riesgo mayor de padecer ciertas enfermedades e infecciones a largo plazo, como tambin de sufrir lesiones por cadas. Las cadas son la causa principal de las fracturas de huesos y lesiones en la cabeza de personas mayores de 65 aos de edad. Recibir cuidados preventivos de forma regular puede ayudarlo a mantenerse saludable y en buen estado. Los cuidados preventivos incluyen realizarse anlisis de forma regular y realizar cambios en el estilo de vida segn las recomendaciones del mdico. Converse con el mdico sobre lo siguiente: Las pruebas de deteccin y los anlisis que debe realizarse. Una prueba de deteccin es un estudio que se para detectar la presencia de una enfermedad cuando no tiene sntomas. Un plan de dieta y ejercicios adecuado para usted. Qu debo saber sobre las pruebas de deteccin y los anlisis para prevenir cadas? Realizarse pruebas de deteccin y anlisis es la mejor manera de detectar un problema de salud de forma temprana. El diagnstico y tratamiento tempranos le brindan la mejor oportunidad de controlar las afecciones mdicas que son comunes despus de los 65 aos de edad. Ciertas afecciones y elecciones de estilo de vida pueden hacer que sea ms propenso a sufrir una cada. El mdico puede recomendarle lo siguiente: Controles regulares de la visin. Una visin deficiente y afecciones como las cataratas pueden hacer que sea ms propenso a sufrir una cada. Si usa lentes, asegrese de obtener una receta actualizada si su visin cambia. Revisin de medicamentos. Revise regularmente con el mdico todos los medicamentos que toma, incluidos los medicamentos de venta libre. Consulte al mdico sobre los efectos secundarios que pueden hacer que sea ms propenso a sufrir una cada. Informe al mdico si alguno de los medicamentos que toma lo hace sentir mareado o  somnoliento. Controles de fuerza y equilibrio. El mdico puede recomendar ciertos estudios para controlar su fuerza y equilibrio al estar de pie, al caminar o al cambiar de posicin. Examen de los pies. El dolor y el adormecimiento en los pies, como tambin no utilizar el calzado adecuado, pueden hacer que sea ms propenso a sufrir una cada. Pruebas de deteccin, que incluyen las siguientes: Pruebas de deteccin para la osteoporosis. La osteoporosis es una afeccin que hace que los huesos se tornen ms dbiles y se quiebren con ms facilidad. Pruebas de deteccin para la presin arterial. Los cambios en la presin arterial y los medicamentos para controlar la presin arterial pueden hacerlo sentir mareado. Prueba de deteccin de la depresin. Es ms probable que sufra una cada si tiene miedo a caerse, se siente deprimido o se siente incapaz de realizar actividades que sola hacer. Prueba de deteccin de consumo de alcohol. Beber demasiado alcohol puede afectar su equilibrio y puede hacer que sea ms propenso a sufrir una cada. Siga estas indicaciones en su casa: Estilo de vida No beba alcohol si: Su mdico le indica no hacerlo. Si bebe alcohol: Limite la cantidad que bebe a lo siguiente: De 0 a 1 medida por da para las mujeres. De 0 a 2 medidas por da para los hombres. Sepa cunta cantidad de alcohol hay en las bebidas que toma. En los [place], una medida equivale a una botella de cerveza de 12 oz (355 ml), un vaso de vino de 5 oz (148 ml) o un vaso de una bebida alcohlica de alta graduacin de 1 oz (44 ml). No consuma ningn producto que   contenga nicotina o tabaco. Estos productos incluyen cigarrillos, tabaco para mascar y aparatos de vapeo, como los cigarrillos electrnicos. Si necesita ayuda para dejar de consumir estos productos, consulte al mdico. Actividad  Siga un programa de ejercicio regular para mantenerse en forma. Esto lo ayudar a mantener el equilibrio. Consulte al  mdico qu tipos de ejercicios son adecuados para usted. Si necesita un bastn o un andador, selo segn las recomendaciones del mdico. Utilice calzado con buen apoyo y suela antideslizante. Seguridad  Retire los objetos que puedan causar tropiezos tales como alfombras, cables u obstculos. Instale equipos de seguridad, como barras para sostn en los baos y barandas de seguridad en las escaleras. Mantenga las habitaciones y los pasillos bien iluminados. Indicaciones generales Hable con el mdico sobre sus riesgos de sufrir una cada. Infrmele a su mdico si: Se cae. Asegrese de informarle a su mdico acerca de todas las cadas, incluso aquellas que parecen ser menores. Se siente mareado, cansado (tiene fatiga) o siente que pierde el equilibrio. Use los medicamentos de venta libre y los recetados solamente como se lo haya indicado el mdico. Estos incluyen suplementos. Siga una dieta sana y mantenga un peso saludable. Una dieta saludable incluye productos lcteos descremados, carnes bajas en contenido de grasa (magras), fibra de granos enteros, frijoles y muchas frutas y verduras. Mantngase al da con las vacunas. Realcese los estudios de rutina de la salud, dentales y de la vista. Resumen Tener un estilo de vida saludable y recibir cuidados preventivos pueden ayudar a promover la salud y el bienestar despus de los 65 aos de edad. Realizarse pruebas de deteccin y anlisis es la mejor manera de detectar un problema de salud de forma temprana y ayudarlo a evitar una cada. El diagnstico y tratamiento tempranos le brindan la mejor oportunidad de controlar las afecciones mdicas ms comunes en las personas mayores de 65 aos de edad. Las cadas son la causa principal de las fracturas de huesos y lesiones en la cabeza de personas mayores de 65 aos de edad. Tome precauciones para evitar una cada en su casa. Trabaje con el mdico para saber qu cambios que puede hacer para mejorar su salud y  bienestar, y para prevenir las cadas. Esta informacin no tiene como fin reemplazar el consejo del mdico. Asegrese de hacerle al mdico cualquier pregunta que tenga. Document Revised: 04/05/2021 Document Reviewed: 04/05/2021 Elsevier Patient Education  2023 Elsevier Inc.  

## 2022-07-11 NOTE — Assessment & Plan Note (Addendum)
Diet and nutrition discussed. Blood work including lipid profile done today. Continue atorvastatin for the time being.

## 2022-07-11 NOTE — Assessment & Plan Note (Signed)
Well-controlled hypertension. BP Readings from Last 3 Encounters:  07/11/22 136/88  12/10/19 106/72  Continue losartan-HCTZ 100-25 mg daily. Well-controlled diabetes. Continue glyburide-metformin 1.25-250 mg daily until blood results available. Cardiovascular risks associated with hypertension and diabetes discussed. Diet and nutrition discussed. Advised to decrease amount of daily carbohydrate intake and daily calories and increase amount of plant based protein in her diet. Follow-up in 3 months.

## 2022-07-11 NOTE — Progress Notes (Signed)
[place] 66 y.o.        Chief Complaint  Patient presents with   New Patient (Initial Visit)      HISTORY OF PRESENT ILLNESS: This is a 67 y.o. female first visit to this office, here to establish care with me. [place] Has a 511 chronic medical problems: 1.  Hypertension on losartan-HCTZ 2.  Diabetes on glyburide/metformin 3.  History of H. pylori infection 4.  Symptoms of dyspepsia 5.  Hypothyroidism on Synthroid 50 mcg daily. Brought recent echocardiogram report which was normal.   HPI            Prior to Admission medications   Medication Sig Start Date End Date Taking? Authorizing Provider  glyBURIDE-metformin (GLUCOVANCE) 1.25-250 MG tablet Take 1 tablet by mouth daily with breakfast.     Yes [provider]  levothyroxine (SYNTHROID) 50 MCG tablet Take 50 mcg by mouth daily before breakfast.     Yes [provider]  losartan (COZAAR) 100 MG tablet Take 100 mg by mouth daily.     Yes [provider]  atorvastatin (LIPITOR) 10 MG tablet Take 10 mg by mouth daily. Patient not taking: Reported on 07/11/2022       [provider]  celecoxib (CELEBREX) 50 MG capsule Take 50 mg by mouth daily as needed for pain. Patient not taking: Reported on 07/11/2022       [provider]  oxyCODONE-acetaminophen (PERCOCET) 5-325 MG tablet 1-2 tabs PO q6 hours prn pain Patient not taking: Reported on 07/11/2022 12/10/19     Leanora Cover, MD      No Known Allergies       Patient Active Problem List    Diagnosis Date Noted   Bilateral carpal tunnel syndrome 06/23/2018          Past Medical History:  Diagnosis Date   Bilateral carpal tunnel syndrome [id number]   Complication of anesthesia     Diabetes mellitus without complication (HCC)     GERD (gastroesophageal reflux disease)     Hypertension     Hypothyroidism     PONV (postoperative nausea and vomiting)             Past Surgical  History:  Procedure Laterality Date   ABDOMINOPLASTY       CARPECTOMY Right 12/10/2019    Procedure: RIGHT PROXIMAL ROW CARPECTOMY POSTERIOR INTER OSSEOUS NERVE NEUROTOMY RADICAL STYLOIDECTOMY;  Surgeon: Leanora Cover, MD;  Location: [facility];  Service: Orthopedics;  Laterality: Right;   KNEE ARTHROSCOPY Right     TUBAL LIGATION          Social History         Socioeconomic History   Marital status: Married      Spouse name: Not on file   Number of children: Not on file   Years of education: Not on file   Highest education level: Not on file  Occupational History   Not on file  Tobacco Use   Smoking status: Never   Smokeless tobacco: Never  Substance and Sexual Activity   Alcohol use: Never   Drug use: Never   Sexual activity: Not on file      Comment: BTL  Other Topics Concern   Not on file  Social History Narrative   Not on file    Social Determinants of Health    Financial Resource Strain: Not on file  Food Insecurity: Not on file  Transportation Needs: Not  on file  Physical Activity: Not on file  Stress: Not on file  Social Connections: Not on file  Intimate Partner Violence: Not on file           Family History  Problem Relation Age of Onset   Hypertension Mother     Diabetes Mother     COPD Mother     Asthma Mother     Arthritis Mother     Hypertension Father     Hyperlipidemia Father     Diabetes Father     Breast cancer Neg Hx          Review of Systems  Constitutional: Negative.  Negative for chills and fever.  HENT: Negative.  Negative for congestion and sore throat.   Respiratory:  Positive for cough. Negative for shortness of breath.   Cardiovascular: Negative.  Negative for chest pain and palpitations.  Gastrointestinal:  Positive for heartburn. Negative for blood in stool and melena.  Genitourinary: Negative.   Skin: Negative.  Negative for rash.  Neurological: Negative.  Negative for dizziness and headaches.  All other  systems reviewed and are negative.      Today's Vitals    07/11/22 1447  BP: 136/88  Pulse: 77  Temp: 98.5 F (36.9 C)  TempSrc: Oral  SpO2: 92%  Weight: 188 lb 2 oz (85.3 kg)  Height: 5\' 1"  (1.549 m)    Body mass index is 35.55 kg/m.     Physical Exam Vitals reviewed.  Constitutional:      Appearance: Normal appearance.  HENT:     Head: Normocephalic.     Mouth/Throat:     Mouth: Mucous membranes are moist.     Pharynx: Oropharynx is clear.  Eyes:     Extraocular Movements: Extraocular movements intact.     Conjunctiva/sclera: Conjunctivae normal.     Pupils: Pupils are equal, round, and reactive to light.  Cardiovascular:     Rate and Rhythm: Normal rate and regular rhythm.     Pulses: Normal pulses.     Heart sounds: Normal heart sounds.  Pulmonary:     Effort: Pulmonary effort is normal.     Breath sounds: Normal breath sounds.  Abdominal:     Palpations: Abdomen is soft.     Tenderness: There is no abdominal tenderness.  Musculoskeletal:     Cervical back: No tenderness.  Lymphadenopathy:     Cervical: No cervical adenopathy.  Skin:    General: Skin is warm and dry.     Capillary Refill: Capillary refill takes less than 2 seconds.  Neurological:     General: No focal deficit present.     Mental Status: She is alert and oriented to person, place, and time.  Psychiatric:        Mood and Affect: Mood normal.        Behavior: Behavior normal.          ASSESSMENT & PLAN: A total of 63 minutes was spent with the patient and counseling/coordination of care regarding preparing for this visit, review of available medical records and those provided by the patient, establishing care with me, review of multiple chronic medical problems and their management, review of all medications, comprehensive history and physical examination, education on nutrition, cardiovascular risk associated with hypertension and diabetes, review of health maintenance items, prognosis,  documentation, and need for follow-up.   Problem List Items Addressed This Visit              Cardiovascular and  Mediastinum    Hypertension associated with diabetes ([place]) - Primary      Well-controlled hypertension.    BP Readings from Last 3 Encounters:  07/11/22 136/88  12/10/19 106/72  Continue losartan-HCTZ 100-25 mg daily. Well-controlled diabetes. Continue glyburide-metformin 1.25-250 mg daily until blood results available. Cardiovascular risks associated with hypertension and diabetes discussed. Diet and nutrition discussed. Advised to decrease amount of daily carbohydrate intake and daily calories and increase amount of plant based protein in her diet. Follow-up in 3 months.          Relevant Orders    CBC with Differential/Platelet    Comprehensive metabolic panel    Hemoglobin A1c    Urine Microalbumin w/creat. ratio        Endocrine    Hypothyroidism    Relevant Orders    TSH    Dyslipidemia associated with type 2 diabetes mellitus ([place])      Diet and nutrition discussed. Blood work including lipid profile done today. Continue atorvastatin for the time being.          Relevant Orders    Lipid panel        Other    Dyspepsia      Almost daily episodes of heartburn. Occasional nausea.  No signs of upper GI bleed. History of recurrent H. pylori infection. Needs GI evaluation and possible upper endoscopy. GI referral placed today.          Relevant Orders    Ambulatory referral to Gastroenterology    History of Helicobacter pylori infection    Relevant Orders    Ambulatory referral to Gastroenterology    Other Visit Diagnoses       Encounter to establish care             Patient Instructions  Mantenimiento de la salud despus de los 21 aos de edad Health Maintenance After Age 35 Despus de los 65 aos de edad, corre un riesgo mayor de Tourist information centre manager enfermedades e infecciones a Barrister's clerk, como tambin de sufrir lesiones por cadas. Las  cadas son la causa principal de las fracturas de huesos y lesiones en la cabeza de personas mayores de 21 aos de edad. Recibir cuidados preventivos de forma regular puede ayudarlo a mantenerse saludable y en buen [place]. Los cuidados preventivos incluyen realizarse anlisis de forma regular y Actor en el estilo de vida segn las recomendaciones del mdico. Converse con el mdico sobre lo siguiente: Las pruebas de deteccin y los anlisis que debe Dispensing optician. Una prueba de deteccin es un estudio que se para Hydrographic surveyor la presencia de una enfermedad cuando no tiene sntomas. Un plan de dieta y ejercicios adecuado para usted. Qu debo saber sobre las pruebas de deteccin y los anlisis para prevenir cadas? Realizarse pruebas de deteccin y [employer] es la mejor manera de Hydrographic surveyor un problema de salud de forma temprana. El diagnstico y tratamiento tempranos le brindan la mejor oportunidad de Chief Technology Officer las afecciones mdicas que son comunes despus de los 96 aos de edad. Ciertas afecciones y elecciones de estilo de vida pueden hacer que sea ms propenso a sufrir Engineer, manufacturing. El mdico puede recomendarle lo siguiente: Controles regulares de la visin. Una visin deficiente y afecciones como las cataratas pueden hacer que sea ms propenso a sufrir Engineer, manufacturing. Si [place] lentes, asegrese de obtener una receta actualizada si su visin cambia. Revisin de medicamentos. Revise regularmente con el mdico todos los medicamentos que toma, incluidos los medicamentos de [place]. Consulte al mdico  sobre los efectos secundarios que pueden hacer que sea ms propenso a sufrir Engineer, site. Informe al mdico si alguno de los medicamentos que toma lo hace sentir mareado o somnoliento. Controles de fuerza y equilibrio. El mdico puede recomendar ciertos estudios para controlar su fuerza y equilibrio al estar de pie, al caminar o al cambiar de posicin. Examen de los pies. El dolor y Materials engineer en los pies, como  tambin no utilizar el calzado [place], pueden hacer que sea ms propenso a sufrir Engineer, site. Pruebas de deteccin, que incluyen las siguientes: Pruebas de deteccin para la osteoporosis. La osteoporosis es una afeccin que hace que los huesos se tornen ms dbiles y se quiebren con ms facilidad. Pruebas de deteccin para la presin arterial. Los cambios en la presin arterial y los medicamentos para Chief Operating Officer la presin arterial pueden hacerlo sentir mareado. Prueba de deteccin de la depresin. Es ms probable que sufra una cada si tiene miedo a caerse, se siente deprimido o se siente incapaz de Probation officer. Prueba de deteccin de consumo de alcohol. Beber demasiado alcohol puede afectar su equilibrio y puede hacer que sea ms propenso a sufrir Engineer, site. Siga estas indicaciones en su casa: Estilo de vida No beba alcohol si: Su mdico le indica no hacerlo. Si bebe alcohol: Limite la cantidad que bebe a lo siguiente: De 0 a 1 medida por da para las mujeres. De 0 a 2 medidas por da para los hombres. Sepa cunta cantidad de alcohol hay en las bebidas que toma. En los [address], una medida equivale a una botella de cerveza de 12 oz (355 ml), un vaso de vino de 5 oz (148 ml) o un vaso de una bebida alcohlica de alta graduacin de 1 oz (44 ml). No consuma ningn producto que contenga nicotina o tabaco. Estos productos incluyen cigarrillos, tabaco para Theatre manager y aparatos de vapeo, como los Administrator, Civil Service. Si necesita ayuda para dejar de consumir estos productos, consulte al [employer]. Actividad  Siga un programa de ejercicio regular para mantenerse en forma. Esto lo ayudar a Radio producer equilibrio. Consulte al mdico qu tipos de ejercicios son adecuados para usted. Si necesita un bastn o un andador, selo segn las recomendaciones del mdico. Utilice calzado con buen apoyo y suela antideslizante. Seguridad  Retire los [employer] puedan causar tropiezos  tales como alfombras, cables u obstculos. Instale equipos de seguridad, como barras para sostn en los baos y barandas de seguridad en las escaleras. Mantenga las habitaciones y los pasillos bien iluminados. Indicaciones generales Hable con el mdico sobre sus riesgos de sufrir una cada. Infrmele a su mdico si: Se cae. Asegrese de informarle a su mdico acerca de todas las cadas, incluso aquellas que parecen ser [employer]. Se siente mareado, cansado (tiene fatiga) o siente que pierde el equilibrio. Use los medicamentos de venta libre y los recetados solamente como se lo haya indicado el mdico. Estos incluyen suplementos. Siga una dieta sana y [place] un peso saludable. Una dieta saludable incluye productos lcteos descremados, carnes bajas en contenido de grasa ([place]), fibra de granos enteros, frijoles y [place] frutas y verduras. Mantngase al da con las vacunas. Realcese los estudios de rutina de la salud, dentales y de Wellsite geologist. Resumen Tener un estilo de vida saludable y recibir cuidados preventivos pueden ayudar a Research scientist (physical sciences) salud y el bienestar despus de los 65 aos de [place]. Realizarse pruebas de deteccin y [employer] es la mejor manera de Engineer, manufacturing un problema de salud de forma  temprana y ayudarlo a Hydrographic surveyor. El diagnstico y tratamiento tempranos le brindan la mejor oportunidad de Chief Operating Officer las afecciones mdicas ms comunes en las personas mayores de 65 aos de edad. Las cadas son la causa principal de las fracturas de huesos y lesiones en la cabeza de personas mayores de 65 aos de edad. Tome precauciones para evitar una cada en su casa. Trabaje con el mdico para saber qu cambios que puede hacer para mejorar su salud y [place], y para prevenir las cadas. Esta informacin no tiene Theme park manager el consejo del mdico. Asegrese de hacerle al mdico cualquier pregunta que tenga. Document Revised: 04/05/2021 Document Reviewed: 04/05/2021 Elsevier Patient  Education  2023 Elsevier Inc.       Edwina Barth, MD [facility]

## 2022-07-13 ENCOUNTER — Telehealth: Payer: Self-pay

## 2022-07-13 ENCOUNTER — Encounter: Payer: Self-pay | Admitting: Emergency Medicine

## 2022-07-13 NOTE — Telephone Encounter (Signed)
Pt daughter is calling requesting a refill for: losartan (COZAAR) 100 MG tablet glyBURIDE-metformin (GLUCOVANCE) 1.25-250 MG tablet levothyroxine (SYNTHROID) 50 MCG tablet  Pharmacy: [id number] - [place], [place] - [address] AT [address]  NEW PT OF DR.SAGARDIA.  LOV 07/11/22 Rov 10/11/22

## 2022-07-16 ENCOUNTER — Other Ambulatory Visit: Payer: Self-pay | Admitting: Emergency Medicine

## 2022-07-16 MED ORDER — GLYBURIDE-METFORMIN 1.25-250 MG PO TABS: 1.0000 | ORAL_TABLET | Freq: Every day | ORAL | 3 refills | Status: DC

## 2022-07-16 MED ORDER — LEVOTHYROXINE SODIUM 50 MCG PO TABS: 50.0000 ug | ORAL_TABLET | Freq: Every day | ORAL | 3 refills | Status: DC

## 2022-07-16 MED ORDER — LOSARTAN POTASSIUM 100 MG PO TABS: 100.0000 mg | ORAL_TABLET | Freq: Every day | ORAL | 3 refills | Status: DC

## 2022-07-16 NOTE — Telephone Encounter (Signed)
New prescriptions sent to pharmacy of record.  Thanks.

## 2022-07-17 ENCOUNTER — Encounter: Payer: Self-pay | Admitting: *Deleted

## 2022-07-20 DIAGNOSIS — R053 Chronic cough: Secondary | ICD-10-CM | POA: Diagnosis not present

## 2022-08-09 ENCOUNTER — Other Ambulatory Visit: Payer: Self-pay | Admitting: Emergency Medicine

## 2022-08-09 DIAGNOSIS — Z1231 Encounter for screening mammogram for malignant neoplasm of breast: Secondary | ICD-10-CM

## 2022-09-11 ENCOUNTER — Ambulatory Visit: Payer: PRIVATE HEALTH INSURANCE

## 2022-10-11 ENCOUNTER — Ambulatory Visit: Payer: PRIVATE HEALTH INSURANCE | Admitting: Emergency Medicine

## 2022-11-02 ENCOUNTER — Ambulatory Visit
Admission: RE | Admit: 2022-11-02 | Discharge: 2022-11-02 | Disposition: A | Discharge status: Home / Self Care | Payer: 59 | Source: Ambulatory Visit | Attending: Emergency Medicine | Admitting: Emergency Medicine

## 2022-11-02 DIAGNOSIS — Z1231 Encounter for screening mammogram for malignant neoplasm of breast: Secondary | ICD-10-CM

## 2022-11-22 ENCOUNTER — Encounter: Payer: Self-pay | Admitting: Emergency Medicine

## 2022-12-19 ENCOUNTER — Encounter: Payer: Self-pay | Admitting: Emergency Medicine

## 2022-12-19 MED ORDER — LOSARTAN POTASSIUM 100 MG PO TABS: 100.0000 mg | ORAL_TABLET | Freq: Every day | ORAL | 0 refills | Status: DC

## 2022-12-19 MED ORDER — GLYBURIDE-METFORMIN 1.25-250 MG PO TABS: 1.0000 | ORAL_TABLET | Freq: Every day | ORAL | 0 refills | Status: AC

## 2022-12-19 MED ORDER — LEVOTHYROXINE SODIUM 50 MCG PO TABS: 50.0000 ug | ORAL_TABLET | Freq: Every day | ORAL | 0 refills | Status: AC

## 2022-12-27 ENCOUNTER — Ambulatory Visit: Payer: PRIVATE HEALTH INSURANCE | Admitting: Emergency Medicine

## 2023-03-16 ENCOUNTER — Other Ambulatory Visit: Payer: Self-pay | Admitting: Emergency Medicine

## 2023-06-17 ENCOUNTER — Other Ambulatory Visit: Payer: Self-pay | Admitting: Emergency Medicine

## 2023-07-05 DIAGNOSIS — E1169 Type 2 diabetes mellitus with other specified complication: Secondary | ICD-10-CM | POA: Diagnosis not present

## 2023-07-05 DIAGNOSIS — R011 Cardiac murmur, unspecified: Secondary | ICD-10-CM | POA: Diagnosis not present

## 2023-07-05 DIAGNOSIS — E039 Hypothyroidism, unspecified: Secondary | ICD-10-CM | POA: Diagnosis not present

## 2023-07-05 DIAGNOSIS — E785 Hyperlipidemia, unspecified: Secondary | ICD-10-CM | POA: Diagnosis not present

## 2023-07-05 DIAGNOSIS — E119 Type 2 diabetes mellitus without complications: Secondary | ICD-10-CM | POA: Diagnosis not present

## 2023-07-05 DIAGNOSIS — I10 Essential (primary) hypertension: Secondary | ICD-10-CM | POA: Diagnosis not present

## 2023-07-09 ENCOUNTER — Ambulatory Visit: Payer: PRIVATE HEALTH INSURANCE | Admitting: Emergency Medicine

## 2023-07-22 ENCOUNTER — Other Ambulatory Visit: Payer: Self-pay | Admitting: Family Medicine

## 2023-07-22 DIAGNOSIS — Z1231 Encounter for screening mammogram for malignant neoplasm of breast: Secondary | ICD-10-CM

## 2023-08-01 DIAGNOSIS — R011 Cardiac murmur, unspecified: Secondary | ICD-10-CM | POA: Diagnosis not present

## 2023-08-13 IMAGING — MG MM DIGITAL SCREENING BILAT W/ TOMO AND CAD
8 series · 8 of 24 positions shown · non-contrast
Comparison: Previous exam(s).

CLINICAL DATA: Screening.

EXAM:
DIGITAL SCREENING BILATERAL MAMMOGRAM WITH TOMOSYNTHESIS AND CAD
TECHNIQUE: Bilateral screening digital craniocaudal and mediolateral oblique
mammograms were obtained. Bilateral screening digital breast
tomosynthesis was performed. The images were evaluated with
computer-aided detection.

[R MLO synth-2D]
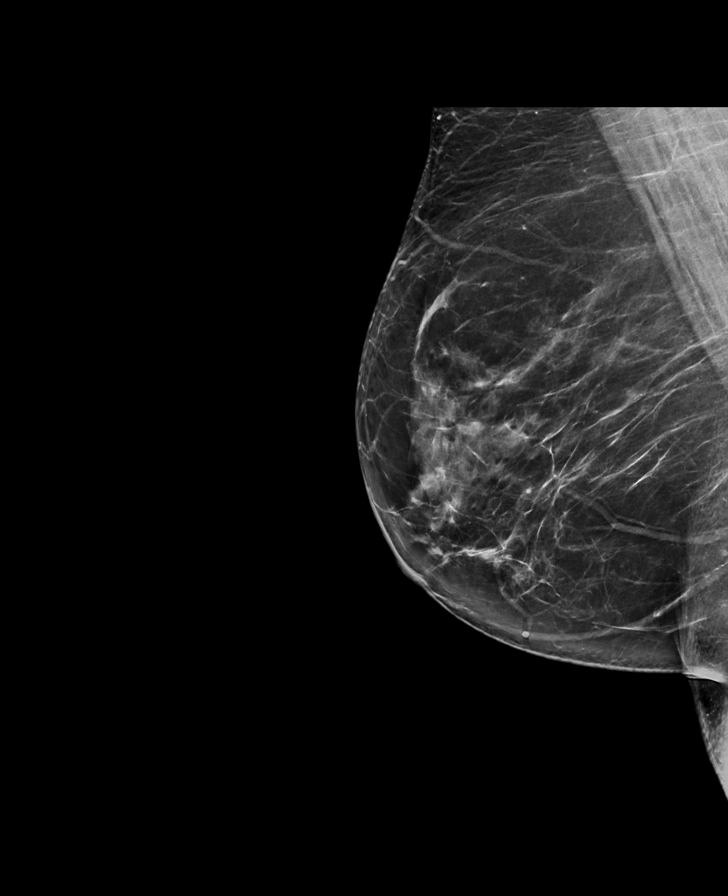

[L MLO synth-2D]
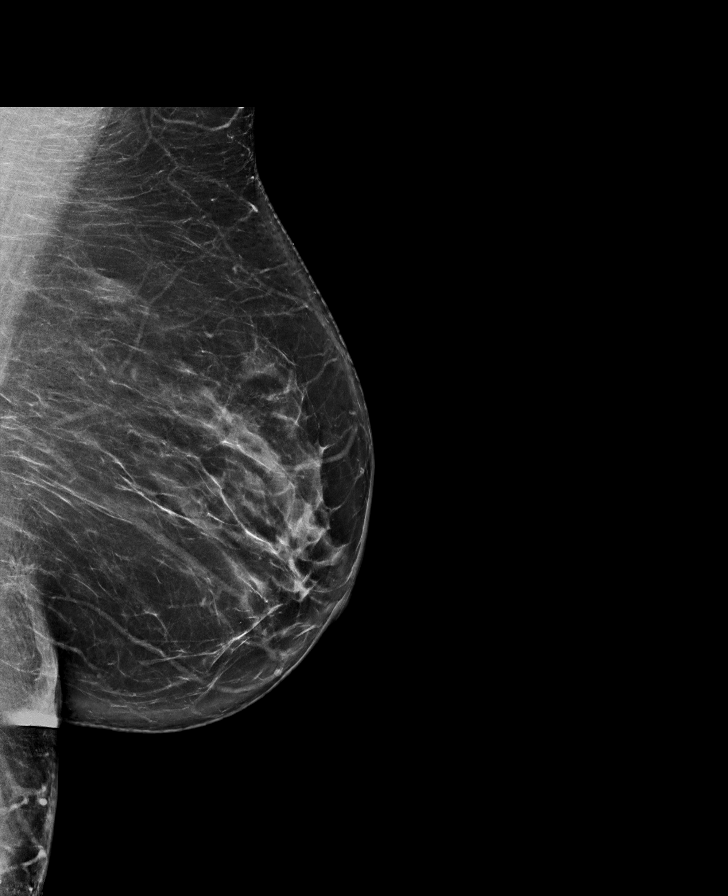

[L CC synth-2D]
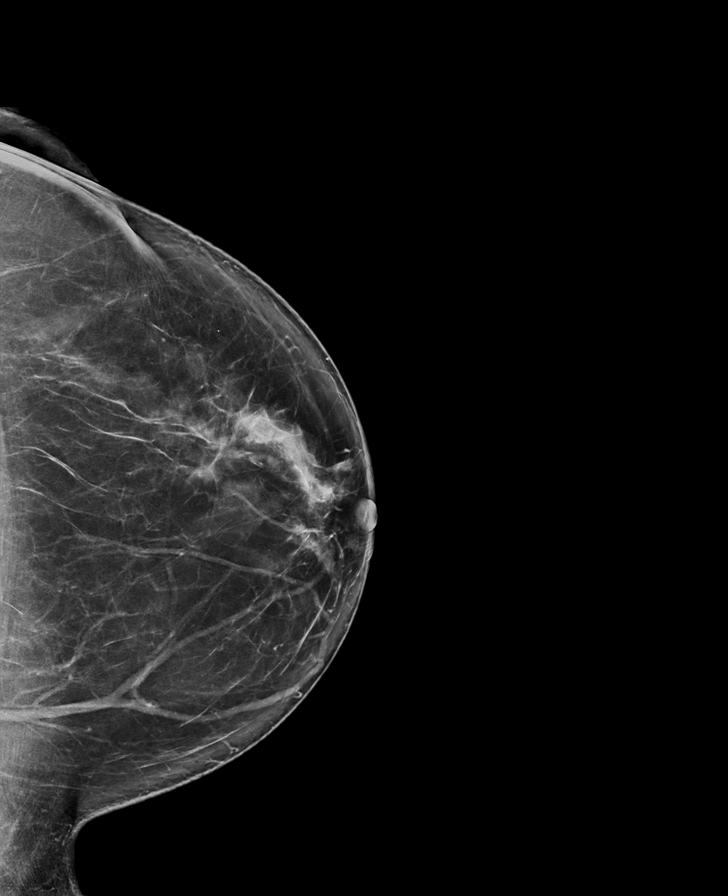

[R CC synth-2D]
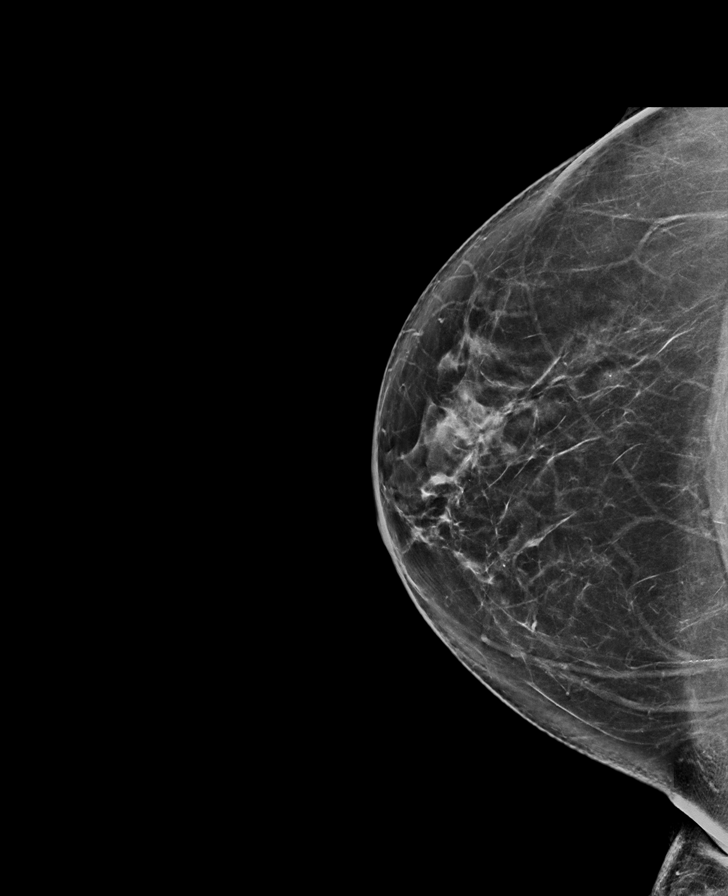

[R CC tomo · tomo slice 42/83.0]
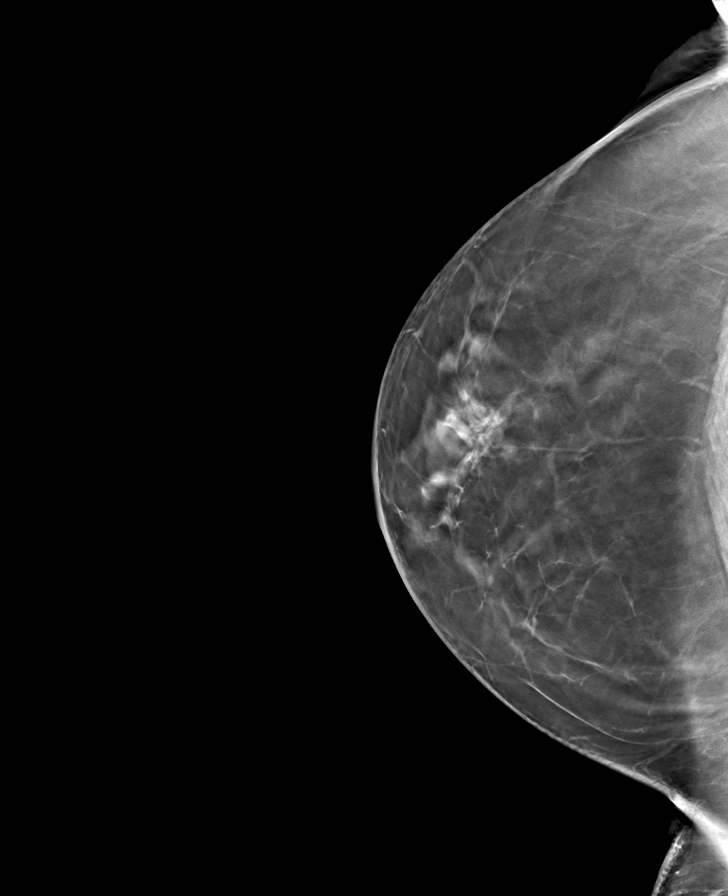

[R MLO tomo · tomo slice 41/81.0]
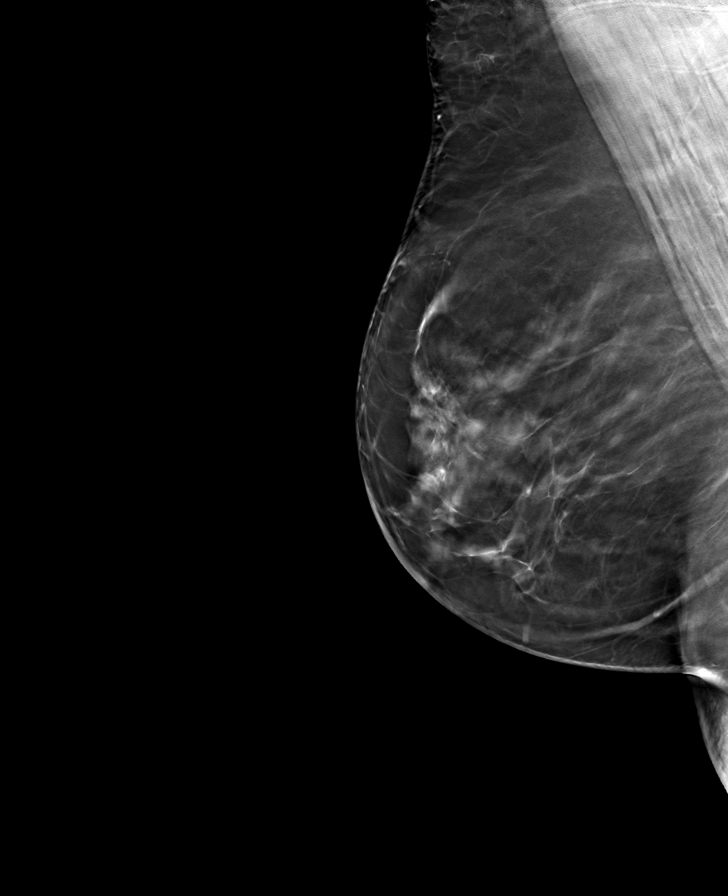

[L MLO tomo · tomo slice 41/82.0]
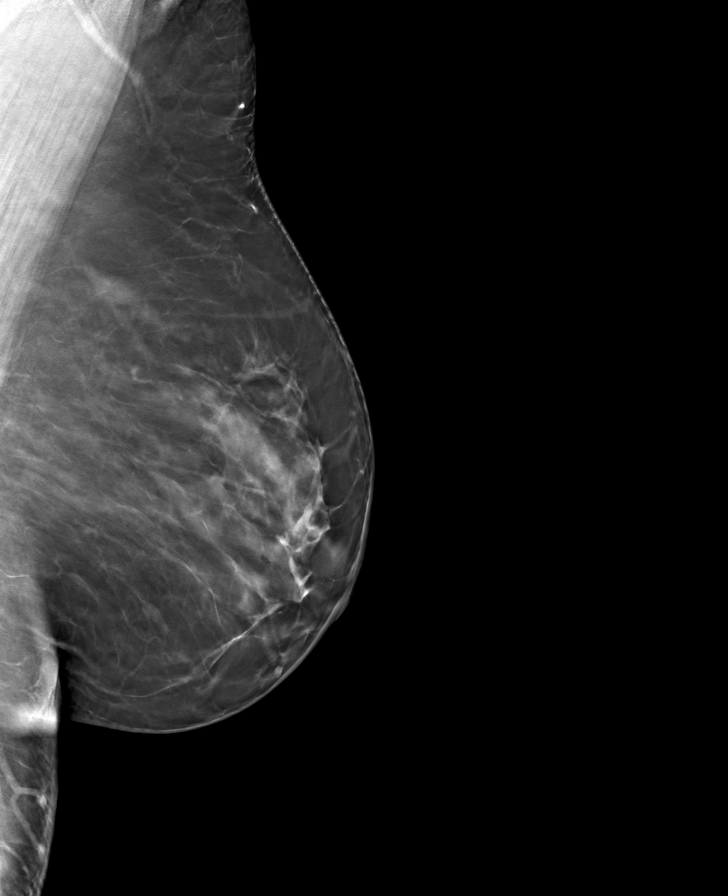

[L CC tomo · tomo slice 43/84.0]
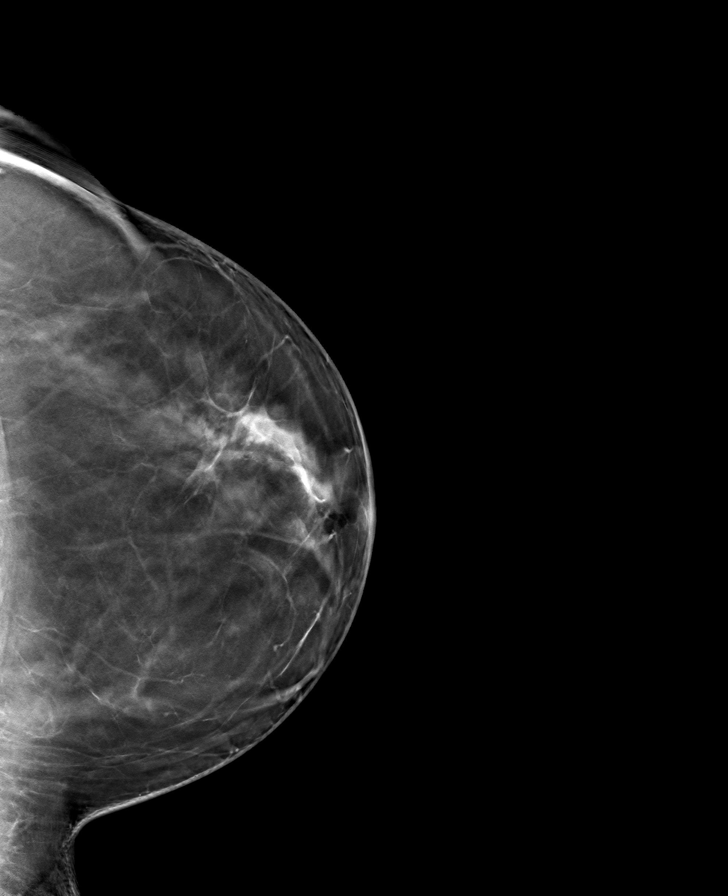

[8 of 24 positions shown; findings below may reference images not displayed]

ACR Breast Density Category c: The breast tissue is heterogeneously
dense, which may obscure small masses.
FINDINGS: There are no findings suspicious for malignancy.
IMPRESSION: No mammographic evidence of malignancy. A result letter of this
screening mammogram will be mailed directly to the patient.

RECOMMENDATION:
Screening mammogram in one year. (Code:Q3-W-BC3)

BI-RADS CATEGORY  1: Negative.

## 2023-11-04 ENCOUNTER — Ambulatory Visit: Payer: PRIVATE HEALTH INSURANCE
